# Patient Record
Sex: Female | Born: 1995 | Race: White | Hispanic: No | Marital: Single | Smoking: Never smoker
Health system: Southern US, Community
[De-identification: ages and names within clinical notes are randomized; demographics above are authoritative.]

---
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
## 2017-06-05 IMAGING — DX DG ANKLE COMPLETE 3+V*R*
3 series · 3 of 3 positions shown · non-contrast
Comparison: None.

CLINICAL DATA: Right ankle pain after softball injury. Initial
encounter.

EXAM:
RIGHT ANKLE - COMPLETE 3+ VIEW

[ankle ap]
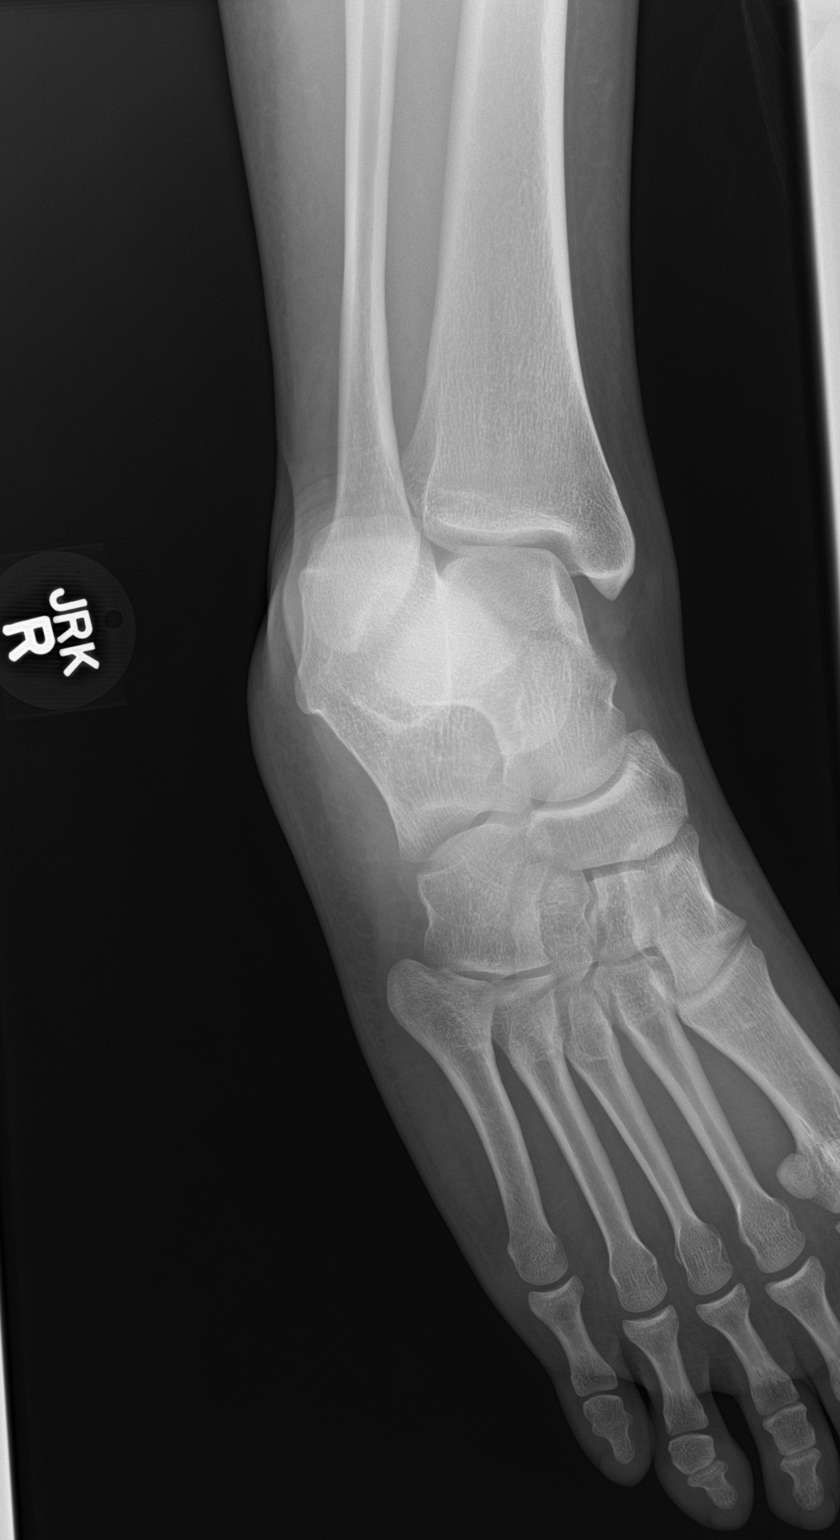

[ankle obl]
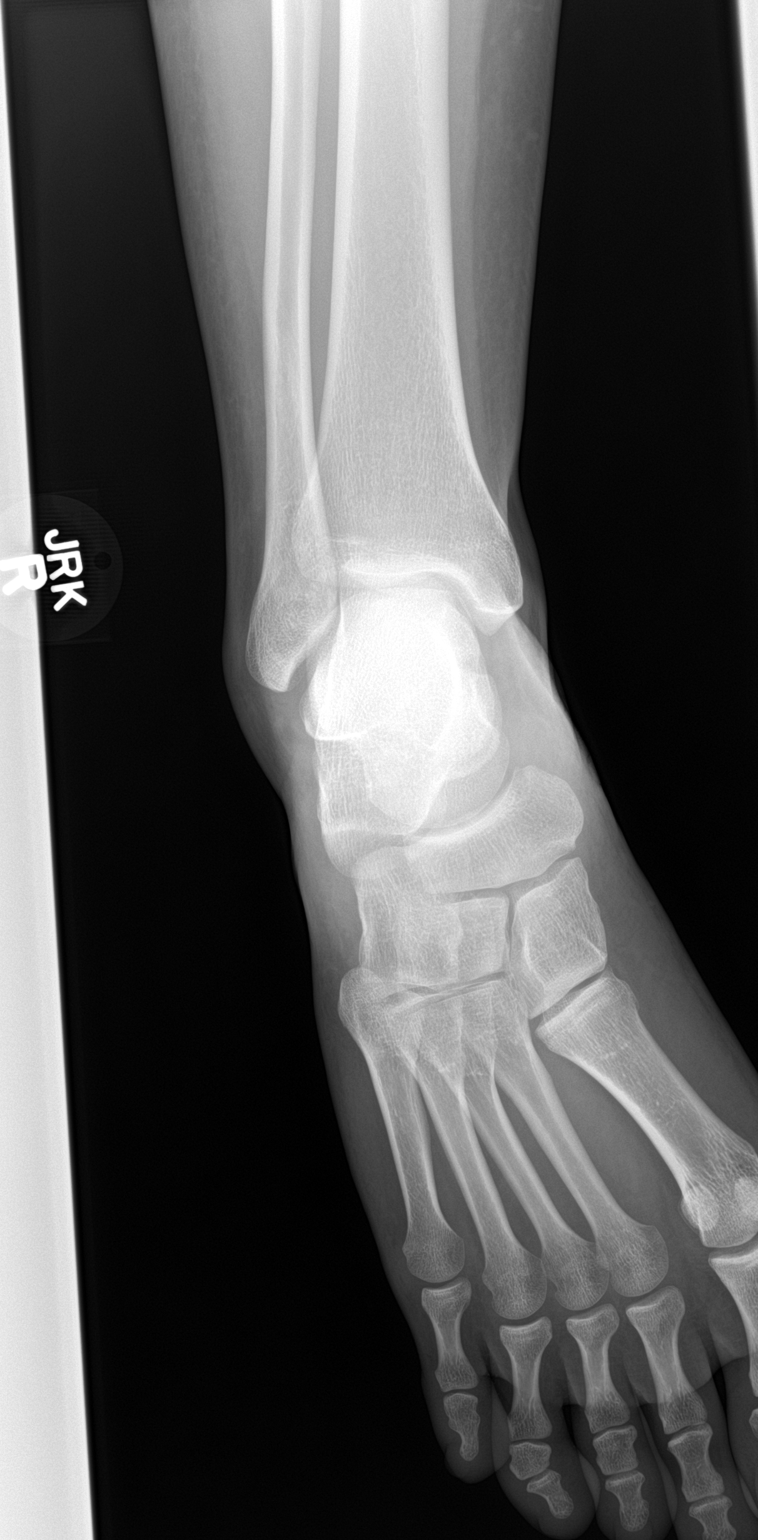

[ankle lat]
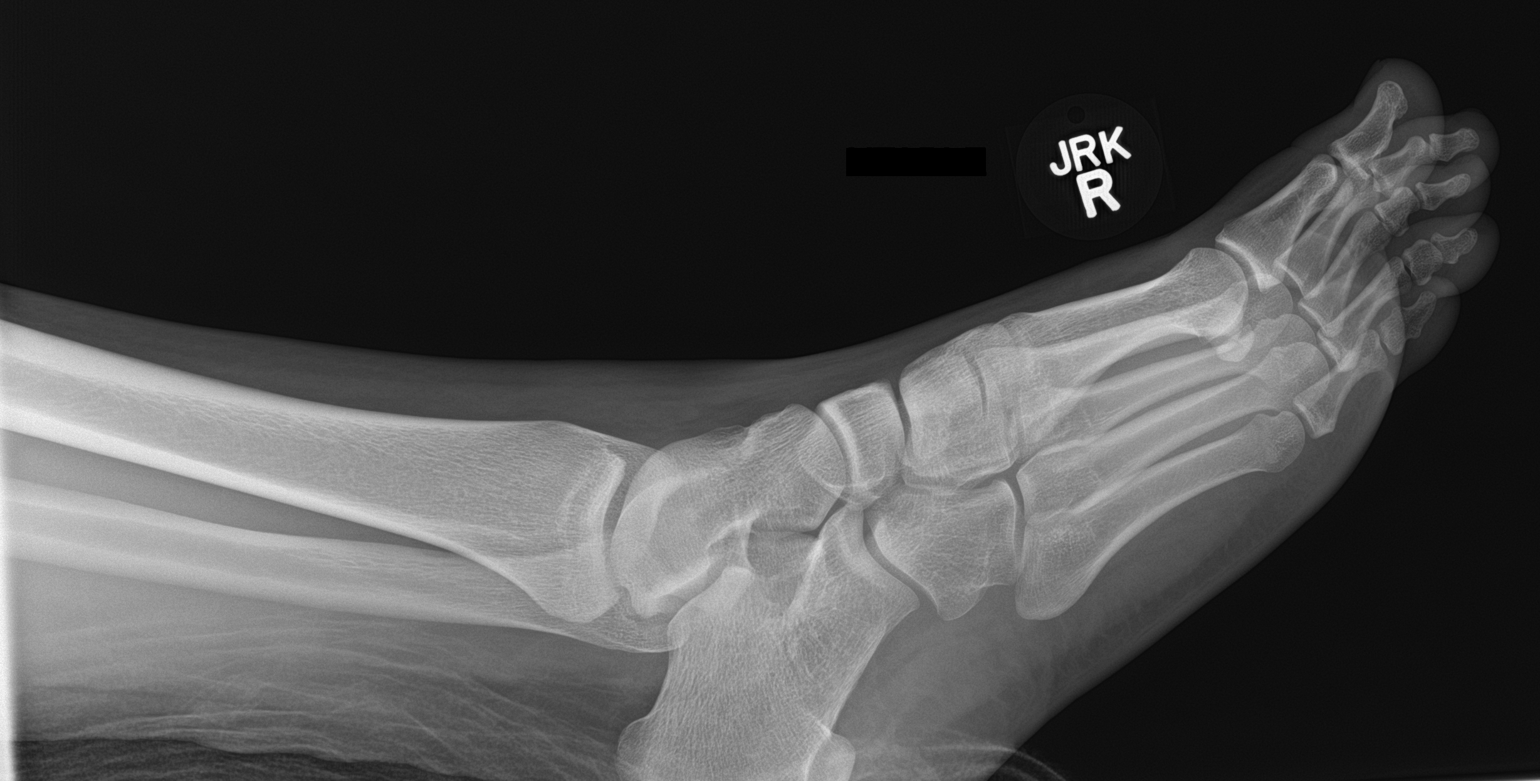

[3 of 3 positions shown; findings below may reference images not displayed]

FINDINGS: There is no evidence of fracture, dislocation, or joint effusion.
IMPRESSION: Negative.
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
